# Patient Record
Sex: Male | Born: 1965 | Race: White | Hispanic: No | Marital: Married | Smoking: Former smoker
Health system: Southern US, Community
[De-identification: ages and names within clinical notes are randomized; demographics above are authoritative.]

## PROBLEM LIST (undated history)

## (undated) HISTORY — PX: CERVICAL SPINE SURGERY: SHX589

## (undated) HISTORY — PX: APPENDECTOMY: SHX54

---

## 2002-07-24 HISTORY — PX: BACK SURGERY: SHX140

## 2016-10-06 ENCOUNTER — Emergency Department (HOSPITAL_COMMUNITY): Payer: No Typology Code available for payment source

## 2016-10-06 ENCOUNTER — Inpatient Hospital Stay (HOSPITAL_COMMUNITY)
Admission: EM | Admit: 2016-10-06 | Discharge: 2016-10-10 | DRG: 505 | Disposition: A | Discharge status: Home Health | Payer: No Typology Code available for payment source | Attending: Internal Medicine | Admitting: Internal Medicine

## 2016-10-06 ENCOUNTER — Encounter (HOSPITAL_COMMUNITY): Payer: Self-pay | Admitting: Emergency Medicine

## 2016-10-06 DIAGNOSIS — R202 Paresthesia of skin: Secondary | ICD-10-CM

## 2016-10-06 DIAGNOSIS — S161XXA Strain of muscle, fascia and tendon at neck level, initial encounter: Secondary | ICD-10-CM | POA: Diagnosis not present

## 2016-10-06 DIAGNOSIS — S93324A Dislocation of tarsometatarsal joint of right foot, initial encounter: Secondary | ICD-10-CM

## 2016-10-06 DIAGNOSIS — Z981 Arthrodesis status: Secondary | ICD-10-CM

## 2016-10-06 DIAGNOSIS — R200 Anesthesia of skin: Secondary | ICD-10-CM

## 2016-10-06 DIAGNOSIS — I493 Ventricular premature depolarization: Secondary | ICD-10-CM | POA: Diagnosis present

## 2016-10-06 DIAGNOSIS — M545 Low back pain, unspecified: Secondary | ICD-10-CM

## 2016-10-06 DIAGNOSIS — S92901A Unspecified fracture of right foot, initial encounter for closed fracture: Secondary | ICD-10-CM | POA: Diagnosis present

## 2016-10-06 DIAGNOSIS — S92331A Displaced fracture of third metatarsal bone, right foot, initial encounter for closed fracture: Secondary | ICD-10-CM | POA: Diagnosis present

## 2016-10-06 DIAGNOSIS — Y92410 Unspecified street and highway as the place of occurrence of the external cause: Secondary | ICD-10-CM

## 2016-10-06 DIAGNOSIS — S92321A Displaced fracture of second metatarsal bone, right foot, initial encounter for closed fracture: Principal | ICD-10-CM | POA: Diagnosis present

## 2016-10-06 DIAGNOSIS — S0990XA Unspecified injury of head, initial encounter: Secondary | ICD-10-CM

## 2016-10-06 DIAGNOSIS — Z9889 Other specified postprocedural states: Secondary | ICD-10-CM

## 2016-10-06 DIAGNOSIS — D72829 Elevated white blood cell count, unspecified: Secondary | ICD-10-CM | POA: Diagnosis present

## 2016-10-06 LAB — COMPREHENSIVE METABOLIC PANEL
ALT: 31 U/L (ref 17–63)
AST: 28 U/L (ref 15–41)
Albumin: 4.3 g/dL (ref 3.5–5.0)
Alkaline Phosphatase: 50 U/L (ref 38–126)
Anion gap: 12 (ref 5–15)
BUN: 15 mg/dL (ref 6–20)
CHLORIDE: 104 mmol/L (ref 101–111)
CO2: 25 mmol/L (ref 22–32)
Calcium: 9.3 mg/dL (ref 8.9–10.3)
Creatinine, Ser: 1.07 mg/dL (ref 0.61–1.24)
GFR calc Af Amer: >60 mL/min (ref >60)
Glucose, Bld: 112 mg/dL — ABNORMAL HIGH (ref 65–99)
Potassium: 3.6 mmol/L (ref 3.5–5.1)
Sodium: 141 mmol/L (ref 135–145)
Total Bilirubin: 1.3 mg/dL — ABNORMAL HIGH (ref 0.3–1.2)
Total Protein: 7.2 g/dL (ref 6.5–8.1)

## 2016-10-06 LAB — I-STAT CG4 LACTIC ACID, ED: LACTIC ACID, VENOUS: 1.9 mmol/L (ref 0.5–1.9)

## 2016-10-06 LAB — CBC
HEMATOCRIT: 47.2 % (ref 39.0–52.0)
HEMOGLOBIN: 16.2 g/dL (ref 13.0–17.0)
MCH: 29.7 pg (ref 26.0–34.0)
MCHC: 34.3 g/dL (ref 30.0–36.0)
MCV: 86.4 fL (ref 78.0–100.0)
Platelets: 307 10*3/uL (ref 150–400)
RBC: 5.46 MIL/uL (ref 4.22–5.81)
RDW: 12.6 % (ref 11.5–15.5)
WBC: 14.2 10*3/uL — AB (ref 4.0–10.5)

## 2016-10-06 LAB — SAMPLE TO BLOOD BANK

## 2016-10-06 LAB — URINALYSIS, ROUTINE W REFLEX MICROSCOPIC
BACTERIA UA: NONE SEEN
BILIRUBIN URINE: NEGATIVE
Glucose, UA: NEGATIVE mg/dL
Ketones, ur: NEGATIVE mg/dL
LEUKOCYTES UA: NEGATIVE
NITRITE: NEGATIVE
PROTEIN: 30 mg/dL — AB
Specific Gravity, Urine: 1.042 — ABNORMAL HIGH (ref 1.005–1.030)
Squamous Epithelial / HPF: NONE SEEN
pH: 6 (ref 5.0–8.0)

## 2016-10-06 LAB — PROTIME-INR
INR: 1.04
Prothrombin Time: 13.6 seconds (ref 11.4–15.2)

## 2016-10-06 LAB — ETHANOL: Alcohol, Ethyl (B): <5 mg/dL (ref <5)

## 2016-10-06 MED ORDER — OXYCODONE-ACETAMINOPHEN 5-325 MG PO TABS
1.0000 | ORAL_TABLET | Freq: Once | ORAL | Status: AC
  Administered 2016-10-06: 1 via ORAL
  Filled 2016-10-06: qty 1

## 2016-10-06 MED ORDER — ONDANSETRON HCL 4 MG/2ML IJ SOLN
4.0000 mg | Freq: Once | INTRAMUSCULAR | Status: AC
  Administered 2016-10-06: 4 mg via INTRAVENOUS
  Filled 2016-10-06: qty 2

## 2016-10-06 MED ORDER — SODIUM CHLORIDE 0.9 % IV BOLUS (SEPSIS)
1000.0000 mL | Freq: Once | INTRAVENOUS | Status: AC
  Administered 2016-10-06: 1000 mL via INTRAVENOUS

## 2016-10-06 MED ORDER — MORPHINE SULFATE (PF) 4 MG/ML IV SOLN
4.0000 mg | Freq: Once | INTRAVENOUS | Status: AC
  Administered 2016-10-06: 4 mg via INTRAVENOUS
  Filled 2016-10-06: qty 1

## 2016-10-06 MED ORDER — HYDROMORPHONE HCL 1 MG/ML IJ SOLN
1.0000 mg | INTRAMUSCULAR | Status: DC | PRN
  Administered 2016-10-06 – 2016-10-07 (×3): 1 mg via INTRAVENOUS
  Filled 2016-10-06 (×3): qty 1

## 2016-10-06 MED ORDER — IOPAMIDOL (ISOVUE-300) INJECTION 61%
INTRAVENOUS | Status: AC
  Administered 2016-10-06: 100 mL
  Filled 2016-10-06: qty 100

## 2016-10-06 MED ORDER — LIDOCAINE HCL (PF) 1 % IJ SOLN
5.0000 mL | Freq: Once | INTRAMUSCULAR | Status: AC
  Administered 2016-10-06: 5 mL
  Filled 2016-10-06: qty 5

## 2016-10-06 NOTE — ED Notes (Signed)
MD at bedside. 

## 2016-10-06 NOTE — ED Notes (Signed)
Patient in xray 
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
## 2016-10-06 NOTE — ED Notes (Signed)
Patient returned to room and placed back on monitor.  
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
## 2016-10-06 NOTE — ED Notes (Signed)
Care handoff to Brooke RN 

## 2016-10-06 NOTE — ED Notes (Signed)
Patient's mother, Lucien Mons, may be reached at [phone].

## 2016-10-06 NOTE — ED Notes (Signed)
Patient transported to CT 

## 2016-10-06 NOTE — ED Notes (Signed)
PT taken to radiology.

## 2016-10-07 ENCOUNTER — Observation Stay (HOSPITAL_COMMUNITY): Payer: No Typology Code available for payment source | Admitting: Anesthesiology

## 2016-10-07 ENCOUNTER — Encounter (HOSPITAL_COMMUNITY)
Admission: EM | Disposition: A | Discharge status: Home Health | Payer: Self-pay | Source: Home / Self Care | Attending: Internal Medicine

## 2016-10-07 ENCOUNTER — Encounter (HOSPITAL_COMMUNITY): Payer: Self-pay | Admitting: Anesthesiology

## 2016-10-07 ENCOUNTER — Emergency Department (HOSPITAL_COMMUNITY): Payer: No Typology Code available for payment source

## 2016-10-07 DIAGNOSIS — S92321A Displaced fracture of second metatarsal bone, right foot, initial encounter for closed fracture: Secondary | ICD-10-CM | POA: Diagnosis present

## 2016-10-07 DIAGNOSIS — I493 Ventricular premature depolarization: Secondary | ICD-10-CM | POA: Diagnosis present

## 2016-10-07 DIAGNOSIS — S0990XA Unspecified injury of head, initial encounter: Secondary | ICD-10-CM

## 2016-10-07 DIAGNOSIS — M545 Low back pain: Secondary | ICD-10-CM

## 2016-10-07 DIAGNOSIS — S161XXA Strain of muscle, fascia and tendon at neck level, initial encounter: Secondary | ICD-10-CM | POA: Diagnosis present

## 2016-10-07 DIAGNOSIS — S62321A Displaced fracture of shaft of second metacarpal bone, left hand, initial encounter for closed fracture: Secondary | ICD-10-CM

## 2016-10-07 DIAGNOSIS — D72829 Elevated white blood cell count, unspecified: Secondary | ICD-10-CM

## 2016-10-07 DIAGNOSIS — S93314A Dislocation of tarsal joint of right foot, initial encounter: Secondary | ICD-10-CM

## 2016-10-07 DIAGNOSIS — S92901A Unspecified fracture of right foot, initial encounter for closed fracture: Secondary | ICD-10-CM | POA: Diagnosis present

## 2016-10-07 DIAGNOSIS — Z981 Arthrodesis status: Secondary | ICD-10-CM | POA: Diagnosis not present

## 2016-10-07 DIAGNOSIS — S92331B Displaced fracture of third metatarsal bone, right foot, initial encounter for open fracture: Secondary | ICD-10-CM

## 2016-10-07 DIAGNOSIS — S93324A Dislocation of tarsometatarsal joint of right foot, initial encounter: Secondary | ICD-10-CM

## 2016-10-07 DIAGNOSIS — S92331A Displaced fracture of third metatarsal bone, right foot, initial encounter for closed fracture: Secondary | ICD-10-CM | POA: Diagnosis present

## 2016-10-07 DIAGNOSIS — Y92410 Unspecified street and highway as the place of occurrence of the external cause: Secondary | ICD-10-CM | POA: Diagnosis not present

## 2016-10-07 HISTORY — PX: ORIF TOE FRACTURE: SHX5032

## 2016-10-07 HISTORY — PX: ORIF ANKLE FRACTURE: SUR919

## 2016-10-07 LAB — COMPREHENSIVE METABOLIC PANEL
ALBUMIN: 4 g/dL (ref 3.5–5.0)
ALT: 26 U/L (ref 17–63)
AST: 24 U/L (ref 15–41)
Alkaline Phosphatase: 45 U/L (ref 38–126)
Anion gap: 9 (ref 5–15)
BUN: 10 mg/dL (ref 6–20)
CHLORIDE: 104 mmol/L (ref 101–111)
CO2: 25 mmol/L (ref 22–32)
CREATININE: 1 mg/dL (ref 0.61–1.24)
Calcium: 8.8 mg/dL — ABNORMAL LOW (ref 8.9–10.3)
GFR calc Af Amer: >60 mL/min (ref >60)
GFR calc non Af Amer: >60 mL/min (ref >60)
Glucose, Bld: 114 mg/dL — ABNORMAL HIGH (ref 65–99)
POTASSIUM: 3.8 mmol/L (ref 3.5–5.1)
SODIUM: 138 mmol/L (ref 135–145)
Total Bilirubin: 1.8 mg/dL — ABNORMAL HIGH (ref 0.3–1.2)
Total Protein: 6.9 g/dL (ref 6.5–8.1)

## 2016-10-07 LAB — CBC
HCT: 44.5 % (ref 39.0–52.0)
Hemoglobin: 15.2 g/dL (ref 13.0–17.0)
MCH: 29.6 pg (ref 26.0–34.0)
MCHC: 34.2 g/dL (ref 30.0–36.0)
MCV: 86.7 fL (ref 78.0–100.0)
PLATELETS: 284 10*3/uL (ref 150–400)
RBC: 5.13 MIL/uL (ref 4.22–5.81)
RDW: 12.7 % (ref 11.5–15.5)
WBC: 13.4 10*3/uL — AB (ref 4.0–10.5)

## 2016-10-07 LAB — SURGICAL PCR SCREEN
MRSA, PCR: NEGATIVE
Staphylococcus aureus: NEGATIVE

## 2016-10-07 LAB — MAGNESIUM: MAGNESIUM: 1.8 mg/dL (ref 1.7–2.4)

## 2016-10-07 LAB — TROPONIN I: Troponin I: <0.03 ng/mL (ref <0.03)

## 2016-10-07 LAB — TSH: TSH: 4.649 u[IU]/mL — AB (ref 0.350–4.500)

## 2016-10-07 LAB — HIV ANTIBODY (ROUTINE TESTING W REFLEX): HIV SCREEN 4TH GENERATION: NONREACTIVE

## 2016-10-07 LAB — T4, FREE: FREE T4: 0.87 ng/dL (ref 0.61–1.12)

## 2016-10-07 SURGERY — OPEN REDUCTION INTERNAL FIXATION (ORIF) METATARSAL (TOE) FRACTURE
Anesthesia: General | Laterality: Right

## 2016-10-07 MED ORDER — SODIUM CHLORIDE 0.9% FLUSH
3.0000 mL | Freq: Two times a day (BID) | INTRAVENOUS | Status: DC
  Administered 2016-10-08 – 2016-10-09 (×3): 3 mL via INTRAVENOUS

## 2016-10-07 MED ORDER — SODIUM CHLORIDE 0.9 % IV SOLN
INTRAVENOUS | Status: DC
  Administered 2016-10-09: 06:00:00 via INTRAVENOUS

## 2016-10-07 MED ORDER — CHLORHEXIDINE GLUCONATE 4 % EX LIQD: 60.0000 mL | Freq: Once | CUTANEOUS | Status: DC

## 2016-10-07 MED ORDER — PROMETHAZINE HCL 25 MG/ML IJ SOLN
6.2500 mg | INTRAMUSCULAR | Status: DC | PRN
  Administered 2016-10-07: 6.25 mg via INTRAVENOUS

## 2016-10-07 MED ORDER — PROPOFOL 10 MG/ML IV BOLUS
INTRAVENOUS | Status: DC | PRN
  Administered 2016-10-07: 150 mg via INTRAVENOUS

## 2016-10-07 MED ORDER — SODIUM CHLORIDE 0.9 % IV SOLN
INTRAVENOUS | Status: DC | PRN
  Administered 2016-10-07 (×4): 80 ug via INTRAVENOUS

## 2016-10-07 MED ORDER — LIDOCAINE HCL (CARDIAC) 20 MG/ML IV SOLN
INTRAVENOUS | Status: DC | PRN
  Administered 2016-10-07: 40 mg via INTRAVENOUS

## 2016-10-07 MED ORDER — PHENYLEPHRINE HCL 10 MG/ML IJ SOLN
INTRAVENOUS | Status: DC | PRN
  Administered 2016-10-07: 50 ug/min via INTRAVENOUS

## 2016-10-07 MED ORDER — ONDANSETRON HCL 4 MG/2ML IJ SOLN
4.0000 mg | Freq: Four times a day (QID) | INTRAMUSCULAR | Status: DC | PRN
  Filled 2016-10-07: qty 2

## 2016-10-07 MED ORDER — ONDANSETRON HCL 4 MG/2ML IJ SOLN
INTRAMUSCULAR | Status: DC | PRN
  Administered 2016-10-07: 4 mg via INTRAVENOUS

## 2016-10-07 MED ORDER — BACITRACIN ZINC 500 UNIT/GM EX OINT
TOPICAL_OINTMENT | CUTANEOUS | Status: DC | PRN
  Administered 2016-10-07: 1 via TOPICAL

## 2016-10-07 MED ORDER — CEFAZOLIN SODIUM-DEXTROSE 2-4 GM/100ML-% IV SOLN
2.0000 g | INTRAVENOUS | Status: AC
  Administered 2016-10-07: 2 g via INTRAVENOUS
  Filled 2016-10-07: qty 100

## 2016-10-07 MED ORDER — LORAZEPAM 2 MG/ML IJ SOLN: 0.5000 mg | INTRAMUSCULAR | Status: DC | PRN

## 2016-10-07 MED ORDER — HYDROMORPHONE HCL 1 MG/ML IJ SOLN: 0.2500 mg | INTRAMUSCULAR | Status: DC | PRN

## 2016-10-07 MED ORDER — ACETAMINOPHEN 325 MG PO TABS: 650.0000 mg | ORAL_TABLET | Freq: Four times a day (QID) | ORAL | Status: DC | PRN

## 2016-10-07 MED ORDER — HYDROMORPHONE HCL 2 MG/ML IJ SOLN
1.0000 mg | INTRAMUSCULAR | Status: DC | PRN
  Administered 2016-10-07 – 2016-10-09 (×15): 1 mg via INTRAVENOUS
  Filled 2016-10-07 (×16): qty 1

## 2016-10-07 MED ORDER — BACITRACIN ZINC 500 UNIT/GM EX OINT
TOPICAL_OINTMENT | CUTANEOUS | Status: AC
  Filled 2016-10-07: qty 28.35

## 2016-10-07 MED ORDER — FENTANYL CITRATE (PF) 100 MCG/2ML IJ SOLN
INTRAMUSCULAR | Status: AC
  Filled 2016-10-07: qty 2

## 2016-10-07 MED ORDER — ONDANSETRON HCL 4 MG PO TABS: 4.0000 mg | ORAL_TABLET | Freq: Four times a day (QID) | ORAL | Status: DC | PRN

## 2016-10-07 MED ORDER — CEFAZOLIN SODIUM-DEXTROSE 2-4 GM/100ML-% IV SOLN
2.0000 g | Freq: Four times a day (QID) | INTRAVENOUS | Status: AC
  Administered 2016-10-08 (×3): 2 g via INTRAVENOUS
  Filled 2016-10-07 (×4): qty 100

## 2016-10-07 MED ORDER — PROMETHAZINE HCL 25 MG/ML IJ SOLN
INTRAMUSCULAR | Status: AC
  Administered 2016-10-07: 6.25 mg via INTRAVENOUS
  Filled 2016-10-07: qty 1

## 2016-10-07 MED ORDER — ALBUTEROL SULFATE (2.5 MG/3ML) 0.083% IN NEBU: 2.5000 mg | INHALATION_SOLUTION | RESPIRATORY_TRACT | Status: DC | PRN

## 2016-10-07 MED ORDER — HYDROMORPHONE HCL 2 MG/ML IJ SOLN
0.5000 mg | Freq: Once | INTRAMUSCULAR | Status: AC
  Administered 2016-10-07: 0.5 mg via INTRAVENOUS

## 2016-10-07 MED ORDER — ACETAMINOPHEN 650 MG RE SUPP: 650.0000 mg | Freq: Four times a day (QID) | RECTAL | Status: DC | PRN

## 2016-10-07 MED ORDER — METOCLOPRAMIDE HCL 5 MG/ML IJ SOLN: 5.0000 mg | Freq: Three times a day (TID) | INTRAMUSCULAR | Status: DC | PRN

## 2016-10-07 MED ORDER — MIDAZOLAM HCL 2 MG/2ML IJ SOLN
INTRAMUSCULAR | Status: AC
  Administered 2016-10-07: 1 mg via INTRAVENOUS
  Filled 2016-10-07: qty 2

## 2016-10-07 MED ORDER — MIDAZOLAM HCL 2 MG/2ML IJ SOLN
1.0000 mg | Freq: Once | INTRAMUSCULAR | Status: AC
  Administered 2016-10-07: 1 mg via INTRAVENOUS

## 2016-10-07 MED ORDER — METOCLOPRAMIDE HCL 5 MG PO TABS: 5.0000 mg | ORAL_TABLET | Freq: Three times a day (TID) | ORAL | Status: DC | PRN

## 2016-10-07 MED ORDER — FENTANYL CITRATE (PF) 100 MCG/2ML IJ SOLN
INTRAMUSCULAR | Status: DC | PRN
  Administered 2016-10-07: 100 ug via INTRAVENOUS

## 2016-10-07 MED ORDER — LACTATED RINGERS IV SOLN
INTRAVENOUS | Status: DC | PRN
  Administered 2016-10-07 (×2): via INTRAVENOUS

## 2016-10-07 MED ORDER — BUPIVACAINE-EPINEPHRINE (PF) 0.5% -1:200000 IJ SOLN
INTRAMUSCULAR | Status: DC | PRN
  Administered 2016-10-07: 40 mL via PERINEURAL

## 2016-10-07 MED ORDER — 0.9 % SODIUM CHLORIDE (POUR BTL) OPTIME
TOPICAL | Status: DC | PRN
  Administered 2016-10-07: 400 mL

## 2016-10-07 SURGICAL SUPPLY — 65 items
1.6MM KWIRE ×9 IMPLANT
BANDAGE ACE 3X5.8 VEL STRL LF (GAUZE/BANDAGES/DRESSINGS) ×3 IMPLANT
BANDAGE ACE 4X5 VEL STRL LF (GAUZE/BANDAGES/DRESSINGS) ×6 IMPLANT
BANDAGE ELASTIC 4 VELCRO ST LF (GAUZE/BANDAGES/DRESSINGS) ×3 IMPLANT
BIT DRILL CANN 2.7 (BIT) ×1
BIT DRILL CANN 2.7MM (BIT) ×1
BIT DRILL CANNULATED 2.7 (BIT) ×1 IMPLANT
BIT DRILL SRG 2.7XCANN AO CPLG (BIT) ×1 IMPLANT
BIT DRL SRG 2.7XCANN AO CPLNG (BIT) ×1
BLADE SURG 15 STRL LF DISP TIS (BLADE) ×4 IMPLANT
BLADE SURG 15 STRL SS (BLADE) ×8
BNDG ESMARK 4X9 LF (GAUZE/BANDAGES/DRESSINGS) ×3 IMPLANT
BNDG GAUZE ELAST 4 BULKY (GAUZE/BANDAGES/DRESSINGS) ×3 IMPLANT
CHLORAPREP W/TINT 26ML (MISCELLANEOUS) ×3 IMPLANT
CUFF TOURNIQUET SINGLE 34IN LL (TOURNIQUET CUFF) ×3 IMPLANT
DRAPE EXTREMITY T 121X128X90 (DRAPE) IMPLANT
DRAPE OEC MINIVIEW 54X84 (DRAPES) ×3 IMPLANT
DRAPE SURG 17X23 STRL (DRAPES) IMPLANT
DRAPE U-SHAPE 47X51 STRL (DRAPES) ×3 IMPLANT
DRILL CANNULATED 2.7 (BIT) ×3
ELECT REM PT RETURN 9FT ADLT (ELECTROSURGICAL) ×3
ELECTRODE REM PT RTRN 9FT ADLT (ELECTROSURGICAL) ×1 IMPLANT
GAUZE SPONGE 4X4 12PLY STRL (GAUZE/BANDAGES/DRESSINGS) ×3 IMPLANT
GAUZE SPONGE 4X4 12PLY STRL LF (GAUZE/BANDAGES/DRESSINGS) ×3 IMPLANT
GAUZE SPONGE 4X4 16PLY XRAY LF (GAUZE/BANDAGES/DRESSINGS) IMPLANT
GAUZE XEROFORM 1X8 LF (GAUZE/BANDAGES/DRESSINGS) ×3 IMPLANT
GAUZE XEROFORM 5X9 LF (GAUZE/BANDAGES/DRESSINGS) ×3 IMPLANT
GLOVE BIO SURGEON STRL SZ7.5 (GLOVE) IMPLANT
GLOVE BIO SURGEON STRL SZ8 (GLOVE) IMPLANT
GLOVE BIOGEL PI IND STRL 8 (GLOVE) IMPLANT
GLOVE BIOGEL PI INDICATOR 8 (GLOVE)
GOWN STRL REUS W/ TWL LRG LVL3 (GOWN DISPOSABLE) ×1 IMPLANT
GOWN STRL REUS W/ TWL XL LVL3 (GOWN DISPOSABLE) ×1 IMPLANT
GOWN STRL REUS W/TWL LRG LVL3 (GOWN DISPOSABLE) ×2
GOWN STRL REUS W/TWL XL LVL3 (GOWN DISPOSABLE) ×2
K-WIRE ORTHOPEDIC 1.4X150L (WIRE) ×6
KIT BASIN OR (CUSTOM PROCEDURE TRAY) ×3 IMPLANT
KWIRE ORTHOPEDIC 1.4X150L (WIRE) ×2 IMPLANT
NDL SAFETY ECLIPSE 18X1.5 (NEEDLE) IMPLANT
NEEDLE HYPO 18GX1.5 SHARP (NEEDLE)
NEEDLE HYPO 25X1 1.5 SAFETY (NEEDLE) IMPLANT
NS IRRIG 1000ML POUR BTL (IV SOLUTION) IMPLANT
PACK ORTHO EXTREMITY (CUSTOM PROCEDURE TRAY) ×3 IMPLANT
PADDING CAST ABS 4INX4YD NS (CAST SUPPLIES) ×4
PADDING CAST ABS COTTON 4X4 ST (CAST SUPPLIES) ×2 IMPLANT
PENCIL BUTTON HOLSTER BLD 10FT (ELECTRODE) ×3 IMPLANT
SCREW CANN 44X15X4XSLF DRL (Screw) ×1 IMPLANT
SCREW CANNU 4.0X48MM (Screw) ×3 IMPLANT
SCREW CANNULATED 4.0X44MM (Screw) ×2 IMPLANT
SPLINT FIBERGLASS 4X30 (CAST SUPPLIES) ×3 IMPLANT
STAPLER SKIN 35 REG (STAPLE) ×3 IMPLANT
STAPLER VISISTAT (STAPLE) ×3 IMPLANT
STAPLER VISISTAT 35W (STAPLE) ×3 IMPLANT
STOCKINETTE 6  STRL (DRAPES) ×2
STOCKINETTE 6 STRL (DRAPES) ×1 IMPLANT
SUT MNCRL AB 3-0 PS2 18 (SUTURE) ×3 IMPLANT
SUT MNCRL AB 4-0 PS2 18 (SUTURE) ×3 IMPLANT
SUT MON AB 5-0 PS2 18 (SUTURE) IMPLANT
SUT PROLENE 4 0 PS 2 18 (SUTURE) IMPLANT
SUT VIC AB 3-0 PS2 18 (SUTURE) ×3 IMPLANT
SUT VIC AB 4-0 PS2 27 (SUTURE) ×3 IMPLANT
SUT VICRYL 4-0 PS2 18IN ABS (SUTURE) IMPLANT
SYR BULB 3OZ (MISCELLANEOUS) ×3 IMPLANT
SYR CONTROL 10ML LL (SYRINGE) IMPLANT
UNDERPAD 30X30 (UNDERPADS AND DIAPERS) ×3 IMPLANT

## 2016-10-07 NOTE — Progress Notes (Signed)
Spoke to Dr. Amalia Hailey via phone. No orders postop noted. Stated orders will be placed very shortly.

## 2016-10-07 NOTE — ED Notes (Signed)
Dr. Laverta Baltimore at bedside resetting patient's RIGHT second toe at this time.

## 2016-10-07 NOTE — Anesthesia Postprocedure Evaluation (Addendum)
Anesthesia Post Note  Patient: Benjamin Collins  Procedure(s) Performed: Procedure(s) (LRB): OPEN REDUCTION INTERNAL FIXATION (ORIF)  MULTIPLE FOOT FRACTURE (Right)  Patient location during evaluation: PACU Anesthesia Type: General Level of consciousness: sedated Pain management: pain level controlled Vital Signs Assessment: post-procedure vital signs reviewed and stable Respiratory status: spontaneous breathing and respiratory function stable Cardiovascular status: stable Anesthetic complications: no       Last Vitals:  Vitals:   10/07/16 2000 10/07/16 2030  BP: 132/73 (!) 143/95  Pulse:    Resp:    Temp:      Last Pain:  Vitals:   10/07/16 2030  TempSrc:   PainSc: 0-No pain                 Scottie Stanish DANIEL
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
## 2016-10-07 NOTE — Anesthesia Procedure Notes (Signed)
Procedure Name: LMA Insertion Date/Time: 10/07/2016 4:56 PM Performed by: Clearnce Sorrel Pre-anesthesia Checklist: Patient identified, Emergency Drugs available, Suction available, Patient being monitored and Timeout performed Patient Re-evaluated:Patient Re-evaluated prior to inductionOxygen Delivery Method: Circle system utilized Preoxygenation: Pre-oxygenation with 100% oxygen Intubation Type: IV induction LMA: LMA inserted LMA Size: 5.0 Number of attempts: 1 Placement Confirmation: positive ETCO2 and breath sounds checked- equal and bilateral Tube secured with: Tape Dental Injury: Teeth and Oropharynx as per pre-operative assessment

## 2016-10-07 NOTE — ED Notes (Signed)
Jerene Pitch, RN notified ortho tech for cam walker.

## 2016-10-07 NOTE — Progress Notes (Signed)
Patient on continuous heart monitor during block.
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
## 2016-10-07 NOTE — ED Notes (Signed)
Tilda Burrow Pulled Dilaudid for Callie Fielding and was given to Pt and had to document under Lovena Le due to not being able to access epic on 10/07/16. Lovena Le could not go into room and document due to her not being able to log off one computer and back into epic again.

## 2016-10-07 NOTE — Brief Op Note (Signed)
10/06/2016 - 10/07/2016  8:49 PM  PATIENT:  Benjamin Collins  51 y.o. male  PRE-OPERATIVE DIAGNOSIS:  MULTIPLE FOOT FRACTURES - RIGHT  POST-OPERATIVE DIAGNOSIS:  OPEN REDUCTION INTERNAL FIXATION SECOND METATARSAL PHALANGEAL JOINT, FRACTURE-DISLOCATION THIRD METATARSAL; NAVICULAR MEDIAL CUNEFORM FRACTURE DISLOCATION , LIS FRANC FRACTURE DISLOCATION - RIGHT  PROCEDURE:  Procedure(s): OPEN REDUCTION INTERNAL FIXATION (ORIF)  MULTIPLE FOOT FRACTURE (Right)  SURGEON:  Surgeon(s) and Role:    * Edrick Kins, DPM - Primary  PHYSICIAN ASSISTANT: none  ASSISTANTS: none   ANESTHESIA:   regional  EBL:  Total I/O In: 1000 [I.V.:1000] Out: 285 [Urine:275; Blood:10]  BLOOD ADMINISTERED:none  DRAINS: none   LOCAL MEDICATIONS USED:  NONE  SPECIMEN:  No Specimen  DISPOSITION OF SPECIMEN:  N/A  COUNTS:  YES  TOURNIQUET:   Total Tourniquet Time Documented: Calf (Right) - 2 minutes Total: Calf (Right) - 2 minutes   DICTATION: .Viviann Spare Dictation  PLAN OF CARE: Admit to inpatient   PATIENT DISPOSITION:  PACU - hemodynamically stable.   Delay start of Pharmacological VTE agent (>24hrs) due to surgical blood loss or risk of bleeding: no

## 2016-10-07 NOTE — ED Notes (Signed)
Patient transported to CT 

## 2016-10-07 NOTE — Anesthesia Preprocedure Evaluation (Addendum)
Anesthesia Evaluation  Patient identified by MRN, date of birth, ID band Patient awake    Reviewed: Allergy & Precautions, NPO status , Patient's Chart, lab work & pertinent test results  History of Anesthesia Complications Negative for: history of anesthetic complications  Airway Mallampati: II  TM Distance: >3 FB Neck ROM: Full    Dental  (+) Partial Upper, Dental Advisory Given   Pulmonary neg pulmonary ROS,    Pulmonary exam normal        Cardiovascular negative cardio ROS Normal cardiovascular exam     Neuro/Psych negative neurological ROS  negative psych ROS   GI/Hepatic negative GI ROS, Neg liver ROS,   Endo/Other  negative endocrine ROS  Renal/GU negative Renal ROS  negative genitourinary   Musculoskeletal   Abdominal   Peds negative pediatric ROS (+)  Hematology negative hematology ROS (+)   Anesthesia Other Findings   Reproductive/Obstetrics negative OB ROS                            Anesthesia Physical Anesthesia Plan  ASA: II  Anesthesia Plan: General   Post-op Pain Management: GA combined w/ Regional for post-op pain   Induction: Intravenous  Airway Management Planned: LMA  Additional Equipment:   Intra-op Plan:   Post-operative Plan: Extubation in OR  Informed Consent: I have reviewed the patients History and Physical, chart, labs and discussed the procedure including the risks, benefits and alternatives for the proposed anesthesia with the patient or authorized representative who has indicated his/her understanding and acceptance.   Dental advisory given  Plan Discussed with: CRNA and Anesthesiologist  Anesthesia Plan Comments:        Anesthesia Quick Evaluation

## 2016-10-07 NOTE — Transfer of Care (Signed)
Immediate Anesthesia Transfer of Care Note  Patient: Benjamin Collins  Procedure(s) Performed: Procedure(s): OPEN REDUCTION INTERNAL FIXATION (ORIF)  MULTIPLE FOOT FRACTURE (Right)  Patient Location: PACU  Anesthesia Type:General and Regional  Level of Consciousness: responds to stimulation  Airway & Oxygen Therapy: Patient Spontanous Breathing and Patient connected to face mask oxygen  Post-op Assessment: Report given to RN and Post -op Vital signs reviewed and stable  Post vital signs: Reviewed and stable  Last Vitals:  Vitals:   10/07/16 1640 10/07/16 1940  BP: (!) 106/54 (P) 136/77  Pulse: 95   Resp: 15   Temp:  (P) 36.4 C    Last Pain:  Vitals:   10/07/16 1940  TempSrc:   PainSc: (P) Asleep         Complications: No apparent anesthesia complications

## 2016-10-07 NOTE — Progress Notes (Signed)
Orthopedic Tech Progress Note Patient Details:  Benjamin Collins June 24, 1966 [id number]  Ortho Devices Type of Ortho Device: CAM walker Ortho Device/Splint Location: rle Ortho Device/Splint Interventions: Ordered Pt was having to much pain to put on cam walker. I left it with him in the room.  Karolee Stamps 10/07/2016, 6:45 AM

## 2016-10-07 NOTE — Anesthesia Procedure Notes (Addendum)
Anesthesia Regional Block: Popliteal block   Pre-Anesthetic Checklist: ,, timeout performed, Correct Patient, Correct Site, Correct Laterality, Correct Procedure, Correct Position, site marked, Risks and benefits discussed,  Surgical consent,  Pre-op evaluation,  At surgeon's request and post-op pain management  Laterality: Right  Prep: chloraprep       Needles:  Injection technique: Single-shot  Needle Type: Echogenic Stimulator Needle          Additional Needles:   Procedures: ultrasound guided,,,,,,,,   Nerve Stimulator or Paresthesia:  Response: 0.45 mA,   Additional Responses:   Narrative:  Start time: 10/07/2016 3:37 PM End time: 10/07/2016 3:47 PM Injection made incrementally with aspirations every 5 mL.  Performed by: Personally  Anesthesiologist: Duane Boston  Additional Notes: A functioning IV was confirmed and monitors were applied.  Sterile prep and drape, hand hygiene and sterile gloves were used.  Negative aspiration and test dose prior to incremental administration of local anesthetic. The patient tolerated the procedure well.Ultrasound  guidance: relevant anatomy identified, needle position confirmed, local anesthetic spread visualized around nerve(s), vascular puncture avoided.  Image printed for medical record. ADC added

## 2016-10-07 NOTE — Interval H&P Note (Signed)
History and Physical Interval Note:  10/07/2016 4:49 PM  [name]  has presented today for surgery, with the diagnosis of foot fx  The various methods of treatment have been discussed with the patient and family. After consideration of risks, benefits and other options for treatment, the patient has consented to  Procedure(s): OPEN REDUCTION INTERNAL FIXATION (ORIF)  MULTIPLE FOOT FRACTURE (Right) as a surgical intervention .  The patient's history has been reviewed, patient examined, no change in status, stable for surgery.  I have reviewed the patient's chart and labs.  Questions were answered to the patient's satisfaction.     Benjamin Collins
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
## 2016-10-07 NOTE — ED Notes (Signed)
This is Benjamin Collins documenting under Erricka Falkner due to computer issues. Pt does not want to wear the boot due to discomfort.

## 2016-10-08 ENCOUNTER — Encounter (HOSPITAL_COMMUNITY): Payer: Self-pay | Admitting: *Deleted

## 2016-10-08 ENCOUNTER — Inpatient Hospital Stay (HOSPITAL_COMMUNITY): Payer: No Typology Code available for payment source

## 2016-10-08 LAB — BASIC METABOLIC PANEL
Anion gap: 6 (ref 5–15)
BUN: 11 mg/dL (ref 6–20)
CALCIUM: 8.3 mg/dL — AB (ref 8.9–10.3)
CO2: 27 mmol/L (ref 22–32)
Chloride: 103 mmol/L (ref 101–111)
Creatinine, Ser: 1.04 mg/dL (ref 0.61–1.24)
GFR calc Af Amer: >60 mL/min (ref >60)
GLUCOSE: 102 mg/dL — AB (ref 65–99)
Potassium: 3.8 mmol/L (ref 3.5–5.1)
Sodium: 136 mmol/L (ref 135–145)

## 2016-10-08 LAB — CBC
HEMATOCRIT: 41.5 % (ref 39.0–52.0)
HEMOGLOBIN: 13.8 g/dL (ref 13.0–17.0)
MCH: 29.3 pg (ref 26.0–34.0)
MCHC: 33.3 g/dL (ref 30.0–36.0)
MCV: 88.1 fL (ref 78.0–100.0)
PLATELETS: 221 10*3/uL (ref 150–400)
RBC: 4.71 MIL/uL (ref 4.22–5.81)
RDW: 12.6 % (ref 11.5–15.5)
WBC: 12.9 10*3/uL — AB (ref 4.0–10.5)

## 2016-10-08 LAB — MAGNESIUM: Magnesium: 1.8 mg/dL (ref 1.7–2.4)

## 2016-10-08 MED ORDER — SENNOSIDES-DOCUSATE SODIUM 8.6-50 MG PO TABS
1.0000 | ORAL_TABLET | Freq: Two times a day (BID) | ORAL | Status: DC
  Administered 2016-10-08 – 2016-10-09 (×4): 1 via ORAL
  Filled 2016-10-08 (×5): qty 1

## 2016-10-08 MED ORDER — OXYCODONE-ACETAMINOPHEN 5-325 MG PO TABS
2.0000 | ORAL_TABLET | ORAL | Status: DC | PRN
  Administered 2016-10-09 – 2016-10-10 (×7): 2 via ORAL
  Filled 2016-10-08 (×7): qty 2

## 2016-10-08 MED ORDER — OXYCODONE-ACETAMINOPHEN 5-325 MG PO TABS
2.0000 | ORAL_TABLET | Freq: Four times a day (QID) | ORAL | Status: DC | PRN
  Administered 2016-10-08 (×2): 2 via ORAL
  Filled 2016-10-08 (×2): qty 2

## 2016-10-08 MED ORDER — DIPHENHYDRAMINE HCL 25 MG PO CAPS
25.0000 mg | ORAL_CAPSULE | ORAL | Status: DC | PRN
  Administered 2016-10-08 – 2016-10-09 (×2): 25 mg via ORAL
  Filled 2016-10-08 (×2): qty 1

## 2016-10-08 MED ORDER — POLYETHYLENE GLYCOL 3350 17 G PO PACK
17.0000 g | PACK | Freq: Every day | ORAL | Status: DC
  Administered 2016-10-08: 17 g via ORAL
  Filled 2016-10-08 (×3): qty 1

## 2016-10-08 MED ORDER — METHOCARBAMOL 500 MG PO TABS
500.0000 mg | ORAL_TABLET | Freq: Three times a day (TID) | ORAL | Status: DC
  Administered 2016-10-08 – 2016-10-10 (×6): 500 mg via ORAL
  Filled 2016-10-08 (×7): qty 1

## 2016-10-08 MED ORDER — IBUPROFEN 200 MG PO TABS
400.0000 mg | ORAL_TABLET | Freq: Once | ORAL | Status: AC
  Administered 2016-10-08: 400 mg via ORAL
  Filled 2016-10-08: qty 2

## 2016-10-08 MED ORDER — GADOBENATE DIMEGLUMINE 529 MG/ML IV SOLN: 20.0000 mL | Freq: Once | INTRAVENOUS | Status: DC | PRN
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
## 2016-10-08 NOTE — Progress Notes (Signed)
Pt is going off the floor for x-ray
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
## 2016-10-09 ENCOUNTER — Other Ambulatory Visit: Payer: Self-pay

## 2016-10-09 ENCOUNTER — Encounter (HOSPITAL_COMMUNITY): Payer: Self-pay | Admitting: General Practice

## 2016-10-09 LAB — BASIC METABOLIC PANEL
Anion gap: 8 (ref 5–15)
BUN: 10 mg/dL (ref 6–20)
CHLORIDE: 101 mmol/L (ref 101–111)
CO2: 29 mmol/L (ref 22–32)
CREATININE: 0.99 mg/dL (ref 0.61–1.24)
Calcium: 8.5 mg/dL — ABNORMAL LOW (ref 8.9–10.3)
GFR calc Af Amer: >60 mL/min (ref >60)
GFR calc non Af Amer: >60 mL/min (ref >60)
Glucose, Bld: 90 mg/dL (ref 65–99)
Potassium: 4 mmol/L (ref 3.5–5.1)
SODIUM: 138 mmol/L (ref 135–145)

## 2016-10-09 LAB — CBC
HCT: 41.7 % (ref 39.0–52.0)
HEMOGLOBIN: 13.9 g/dL (ref 13.0–17.0)
MCH: 29.3 pg (ref 26.0–34.0)
MCHC: 33.3 g/dL (ref 30.0–36.0)
MCV: 87.8 fL (ref 78.0–100.0)
PLATELETS: 234 10*3/uL (ref 150–400)
RBC: 4.75 MIL/uL (ref 4.22–5.81)
RDW: 12.6 % (ref 11.5–15.5)
WBC: 12.9 10*3/uL — ABNORMAL HIGH (ref 4.0–10.5)

## 2016-10-09 MED ORDER — OXYCODONE-ACETAMINOPHEN 5-325 MG PO TABS: 1.0000 | ORAL_TABLET | Freq: Three times a day (TID) | ORAL | 0 refills | Status: DC | PRN

## 2016-10-09 MED ORDER — GABAPENTIN 100 MG PO CAPS: 100.0000 mg | ORAL_CAPSULE | Freq: Two times a day (BID) | ORAL | 0 refills | Status: DC

## 2016-10-09 MED ORDER — HYDROXYZINE HCL 25 MG PO TABS: 25.0000 mg | ORAL_TABLET | Freq: Three times a day (TID) | ORAL | 0 refills | Status: DC | PRN

## 2016-10-09 MED ORDER — GABAPENTIN 100 MG PO CAPS
100.0000 mg | ORAL_CAPSULE | Freq: Two times a day (BID) | ORAL | Status: DC
  Administered 2016-10-09 – 2016-10-10 (×3): 100 mg via ORAL
  Filled 2016-10-09 (×3): qty 1

## 2016-10-09 MED ORDER — HYDROXYZINE HCL 25 MG PO TABS: 25.0000 mg | ORAL_TABLET | Freq: Three times a day (TID) | ORAL | Status: DC | PRN

## 2016-10-09 MED ORDER — SENNOSIDES-DOCUSATE SODIUM 8.6-50 MG PO TABS: 1.0000 | ORAL_TABLET | Freq: Every evening | ORAL | 0 refills | Status: DC | PRN

## 2016-10-09 MED ORDER — METHOCARBAMOL 500 MG PO TABS: 500.0000 mg | ORAL_TABLET | Freq: Three times a day (TID) | ORAL | 0 refills | Status: DC

## 2016-10-09 MED ORDER — POLYETHYLENE GLYCOL 3350 17 G PO PACK: 17.0000 g | PACK | Freq: Every day | ORAL | 0 refills | Status: DC
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
## 2016-10-09 NOTE — Progress Notes (Signed)
Patient had nose bleed at 1500 today. Night shift RN notified in report.

## 2016-10-09 NOTE — Discharge Summary (Addendum)
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 metatarsal bone. Avulsion fracture of the medial aspect proximal right second metatarsal bone consistent with Lisfranc injury. Comminuted and displaced fractures of the medial cuneiform with articular involvement. Electronically Signed   By: Lucienne Capers M.D.   On: 10/06/2016 22:10    Microbiology: Recent Results (from the past 240 hour(s))  Surgical pcr screen     Status: None   Collection Time: 10/07/16  2:45 AM  Result Value Ref Range Status   MRSA, PCR NEGATIVE NEGATIVE Final   Staphylococcus aureus NEGATIVE NEGATIVE Final    Comment:        The Xpert SA Assay (FDA approved for NASAL specimens in patients over 66 years of age), is one component of a comprehensive surveillance program.  Test performance has been validated by [facility] for patients greater than or equal to 13 year old. It is not intended to diagnose infection nor to guide or monitor  treatment.      Labs: CBC:  Recent Labs Lab 10/06/16 1905 10/07/16 0358 10/08/16 0641 10/09/16 0441  WBC 14.2* 13.4* 12.9* 12.9*  HGB 16.2 15.2 13.8 13.9  HCT 47.2 44.5 41.5 41.7  MCV 86.4 86.7 88.1 87.8  PLT 307 284 221 993   Basic Metabolic Panel:  Recent Labs Lab 10/06/16 1905 10/07/16 0358 10/08/16 0641 10/09/16 0441  NA 141 138 136 138  K 3.6 3.8 3.8 4.0  CL 104 104 103 101  CO2 25 25 27 29   GLUCOSE 112* 114* 102* 90  BUN 15 10 11 10   CREATININE 1.07 1.00 1.04 0.99  CALCIUM 9.3 8.8* 8.3* 8.5*  MG  --  1.8 1.8  --    Liver Function Tests:  Recent Labs Lab 10/06/16 1905 10/07/16 0358  AST 28 24  ALT 31 26  ALKPHOS 50 45  BILITOT 1.3* 1.8*  PROT 7.2 6.9  ALBUMIN 4.3 4.0   No results for input(s): LIPASE, AMYLASE in the last 168 hours. No results for input(s): AMMONIA in the last 168 hours. Cardiac Enzymes:  Recent Labs Lab 10/07/16 0358  TROPONINI <0.03   BNP (last 3 results) No results for input(s): BNP in the last 8760 hours. CBG: No results for input(s): GLUCAP in the last 168 hours. Time spent: 30 minutes  Signed:  Berle Mull  [facility]  10/09/2016  , 4:07  PM
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
## 2016-10-10 ENCOUNTER — Telehealth: Payer: Self-pay | Admitting: *Deleted

## 2016-10-10 LAB — CBC
HEMATOCRIT: 40 % (ref 39.0–52.0)
HEMOGLOBIN: 13.9 g/dL (ref 13.0–17.0)
MCH: 30 pg (ref 26.0–34.0)
MCHC: 34.8 g/dL (ref 30.0–36.0)
MCV: 86.4 fL (ref 78.0–100.0)
Platelets: 235 10*3/uL (ref 150–400)
RBC: 4.63 MIL/uL (ref 4.22–5.81)
RDW: 12.6 % (ref 11.5–15.5)
WBC: 11.7 10*3/uL — AB (ref 4.0–10.5)

## 2016-10-10 LAB — BASIC METABOLIC PANEL
ANION GAP: 10 (ref 5–15)
BUN: 9 mg/dL (ref 6–20)
CALCIUM: 8.8 mg/dL — AB (ref 8.9–10.3)
CO2: 28 mmol/L (ref 22–32)
Chloride: 99 mmol/L — ABNORMAL LOW (ref 101–111)
Creatinine, Ser: 0.98 mg/dL (ref 0.61–1.24)
GFR calc non Af Amer: >60 mL/min (ref >60)
Glucose, Bld: 86 mg/dL (ref 65–99)
POTASSIUM: 3.7 mmol/L (ref 3.5–5.1)
Sodium: 137 mmol/L (ref 135–145)
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
## 2016-10-10 NOTE — Progress Notes (Signed)
Orthopedic Tech Progress Note Patient Details:  Benjamin Collins May 10, 1966 [id number]  Ortho Devices Type of Ortho Device: Crutches Ortho Device/Splint Location: rle Ortho Device/Splint Interventions: Application   [name] 10/10/2016, 12:41 PM

## 2016-10-10 NOTE — Plan of Care (Signed)
Problem: Safety: Goal: Ability to remain free from injury will improve Outcome: Progressing Safety precautions noted  Problem: Pain Managment: Goal: General experience of comfort will improve Outcome: Progressing Medicated once for pain this shift with moderate relief  Problem: Tissue Perfusion: Goal: Risk factors for ineffective tissue perfusion will decrease Outcome: Progressing No S/S of DVT noted, SCDs are on  Problem: Bowel/Gastric: Goal: Will not experience complications related to bowel motility Outcome: Progressing Denies bowel and gastric issues, stated that he had BM on 10/09/2016

## 2016-10-10 NOTE — Progress Notes (Addendum)
PT Cancellation Note  Patient Details Name: Benjamin Collins MRN: [id number] DOB: [birth year]   Cancelled Treatment:    Reason Eval/Treat Not Completed: Patient declined, no reason specified Pt declined mobility and reported he feels comfortable with use of RW/crutches and stair negotiation. Pt reminded to wear CAM boot with any OOB mobility.    [place] April, PTA Pager: [phone]   10/10/2016, 2:30 PM

## 2016-10-10 NOTE — Telephone Encounter (Addendum)
Benjamin Collins states pt was to pick up medications at our office. I told her I had not received orders for his discharge medications and Katy checked and found signed rx for percocet and said she would give to pt at discharge.

## 2016-10-10 NOTE — Care Management (Addendum)
Case manager spoke with patient. He does not have personal insurance. Case manager appreciates work done on this by previous Tourist information centre manager. Patient will get crutches and RW from [facility], unfortunately does not qualify for home Health therapy. CM has discussed this with Stevie Kern, [place] Liaison, She will see if patient will qualify for [facility].

## 2016-10-11 ENCOUNTER — Encounter (HOSPITAL_COMMUNITY): Payer: Self-pay | Admitting: Podiatry

## 2016-10-13 ENCOUNTER — Telehealth: Payer: Self-pay | Admitting: *Deleted

## 2016-10-13 NOTE — Telephone Encounter (Signed)
Patient called asking when he can remove the bandages from surgery. I asked if he had a post op appt coming up and he says on Wed (3/28). I informed him that he should leave all bandages on until his first post op appt. He begin to explain that me that he was in his PCP office now and they changed the bandages. I told him that was fine for them to do but not to remove the bandages himself until he saw Dr. Amalia Hailey. He demonstrated understanding of my explanation however sounded somewhat hesitate.

## 2016-10-18 ENCOUNTER — Encounter: Payer: Self-pay | Admitting: Podiatry

## 2016-10-18 ENCOUNTER — Ambulatory Visit (INDEPENDENT_AMBULATORY_CARE_PROVIDER_SITE_OTHER): Payer: Self-pay | Admitting: Podiatry

## 2016-10-18 DIAGNOSIS — Z9889 Other specified postprocedural states: Secondary | ICD-10-CM

## 2016-10-18 MED ORDER — ALPRAZOLAM 0.25 MG PO TABS: 0.2500 mg | ORAL_TABLET | Freq: Every evening | ORAL | 0 refills | Status: DC | PRN
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
## 2016-10-25 ENCOUNTER — Ambulatory Visit (INDEPENDENT_AMBULATORY_CARE_PROVIDER_SITE_OTHER): Payer: Self-pay | Admitting: Podiatry

## 2016-10-25 ENCOUNTER — Encounter: Payer: Self-pay | Admitting: Podiatry

## 2016-10-25 DIAGNOSIS — Z9889 Other specified postprocedural states: Secondary | ICD-10-CM
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
## 2016-11-01 ENCOUNTER — Ambulatory Visit (INDEPENDENT_AMBULATORY_CARE_PROVIDER_SITE_OTHER): Payer: Self-pay | Admitting: Podiatry

## 2016-11-01 ENCOUNTER — Ambulatory Visit (INDEPENDENT_AMBULATORY_CARE_PROVIDER_SITE_OTHER): Payer: Self-pay

## 2016-11-01 DIAGNOSIS — Z9889 Other specified postprocedural states: Secondary | ICD-10-CM

## 2016-11-01 MED ORDER — SULFAMETHOXAZOLE-TRIMETHOPRIM 800-160 MG PO TABS: 1.0000 | ORAL_TABLET | Freq: Two times a day (BID) | ORAL | 0 refills | Status: DC
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
## 2016-11-08 ENCOUNTER — Ambulatory Visit (INDEPENDENT_AMBULATORY_CARE_PROVIDER_SITE_OTHER): Payer: Self-pay | Admitting: Podiatry

## 2016-11-08 DIAGNOSIS — Z9889 Other specified postprocedural states: Secondary | ICD-10-CM
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
## 2016-11-15 ENCOUNTER — Ambulatory Visit: Payer: Self-pay

## 2016-11-15 ENCOUNTER — Ambulatory Visit (INDEPENDENT_AMBULATORY_CARE_PROVIDER_SITE_OTHER): Payer: Self-pay | Admitting: Podiatry

## 2016-11-15 DIAGNOSIS — Z9889 Other specified postprocedural states: Secondary | ICD-10-CM
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
## 2016-11-22 ENCOUNTER — Ambulatory Visit (INDEPENDENT_AMBULATORY_CARE_PROVIDER_SITE_OTHER): Payer: Self-pay | Admitting: Podiatry

## 2016-11-22 ENCOUNTER — Ambulatory Visit (INDEPENDENT_AMBULATORY_CARE_PROVIDER_SITE_OTHER): Payer: Self-pay

## 2016-11-22 DIAGNOSIS — Z9889 Other specified postprocedural states: Secondary | ICD-10-CM
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
## 2016-12-11 ENCOUNTER — Encounter: Payer: Self-pay | Admitting: Podiatry

## 2016-12-11 ENCOUNTER — Ambulatory Visit (INDEPENDENT_AMBULATORY_CARE_PROVIDER_SITE_OTHER): Payer: Self-pay | Admitting: Podiatry

## 2016-12-11 ENCOUNTER — Ambulatory Visit: Payer: Self-pay

## 2016-12-11 DIAGNOSIS — L97512 Non-pressure chronic ulcer of other part of right foot with fat layer exposed: Secondary | ICD-10-CM

## 2016-12-11 DIAGNOSIS — S92301D Fracture of unspecified metatarsal bone(s), right foot, subsequent encounter for fracture with routine healing: Secondary | ICD-10-CM

## 2016-12-11 MED ORDER — SULFAMETHOXAZOLE-TRIMETHOPRIM 800-160 MG PO TABS: 1.0000 | ORAL_TABLET | Freq: Two times a day (BID) | ORAL | 0 refills | Status: DC

## 2016-12-11 NOTE — Addendum Note (Signed)
Addended by: Tomi Bamberger on: 12/11/2016 01:45 PM   Modules accepted: Orders
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
## 2016-12-14 LAB — WOUND CULTURE
GRAM STAIN: NONE SEEN
GRAM STAIN: NONE SEEN

## 2016-12-23 NOTE — Addendum Note (Signed)
Addendum  created 12/23/16 1059 by Duane Boston, MD   Sign clinical note

## 2016-12-27 ENCOUNTER — Ambulatory Visit (INDEPENDENT_AMBULATORY_CARE_PROVIDER_SITE_OTHER): Payer: Self-pay | Admitting: Podiatry

## 2016-12-27 DIAGNOSIS — L97512 Non-pressure chronic ulcer of other part of right foot with fat layer exposed: Secondary | ICD-10-CM

## 2016-12-27 DIAGNOSIS — Z9889 Other specified postprocedural states: Secondary | ICD-10-CM

## 2016-12-27 DIAGNOSIS — S92301D Fracture of unspecified metatarsal bone(s), right foot, subsequent encounter for fracture with routine healing: Secondary | ICD-10-CM

## 2016-12-27 MED ORDER — AMOXICILLIN-POT CLAVULANATE 875-125 MG PO TABS: 1.0000 | ORAL_TABLET | Freq: Two times a day (BID) | ORAL | 0 refills | Status: DC
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
## 2017-01-10 ENCOUNTER — Ambulatory Visit (INDEPENDENT_AMBULATORY_CARE_PROVIDER_SITE_OTHER): Payer: Self-pay | Admitting: Podiatry

## 2017-01-10 ENCOUNTER — Encounter: Payer: Self-pay | Admitting: Podiatry

## 2017-01-10 DIAGNOSIS — S92301D Fracture of unspecified metatarsal bone(s), right foot, subsequent encounter for fracture with routine healing: Secondary | ICD-10-CM

## 2017-01-10 DIAGNOSIS — Z9889 Other specified postprocedural states: Secondary | ICD-10-CM
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
## 2017-01-31 ENCOUNTER — Ambulatory Visit (INDEPENDENT_AMBULATORY_CARE_PROVIDER_SITE_OTHER): Payer: Self-pay | Admitting: Podiatry

## 2017-01-31 ENCOUNTER — Encounter: Payer: Self-pay | Admitting: Podiatry

## 2017-01-31 DIAGNOSIS — S92301D Fracture of unspecified metatarsal bone(s), right foot, subsequent encounter for fracture with routine healing: Secondary | ICD-10-CM

## 2017-01-31 DIAGNOSIS — L97512 Non-pressure chronic ulcer of other part of right foot with fat layer exposed: Secondary | ICD-10-CM

## 2017-02-02 NOTE — Progress Notes (Signed)
   HPI: Patient presents today status post ORIF multiple foot fractures of the right foot. Date of surgery 10/07/2016. Patient has been doing very well and denies any significant pain. He is ambulating quite comfortably in the short cam boot. Patient is aware that he can begin transitioning into normal shoe gear, however he is hesitant to do so. Patient states that his small ulcer to the second interdigital webspace is improving significantly. He has been applying Neosporin daily and he believes this is helping to heal his wound. Patient presents today for further treatment and evaluation   Physical Exam: General: The patient is alert and oriented x3 in no acute distress.  Dermatology: There is a very small ulceration noted to the lateral aspect of the second digit second interspace measuring approximately [phone] cm. Wound base is mostly granular and pink. There is no exposed bone muscle-tendon ligament or joint. There is no odor. Negative for drainage. Periwound integrity is intact.  Vascular: Palpable pedal pulses bilaterally. No edema or erythema noted. Capillary refill within normal limits.  Neurological: Epicritic and protective threshold grossly intact bilaterally.   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 Muscle strength 5/5 in all groups bilateral.   Assessment: 1. Status post ORIF multiple foot fractures right foot. Date of surgery 10/07/2016. 2. Small superficial ulceration interdigital webspace right foot-improving   Plan of Care:  1. Patient was evaluated. 2. Continue Neosporin and a Band-Aid daily to the superficial ulceration. 3. Reinforced the recommendation of the patient can begin ambulating in good supportive tennis shoes. 4. Return to clinic in 2 weeks for follow-up on a superficial ulceration  Patient is going back to [place] August 7   Edrick Kins,  DPM [facility]  Dr. Edrick Kins, [address]                                        [place], [place] [address]                Office [phone]  Fax [phone]

## 2017-02-07 ENCOUNTER — Telehealth: Payer: Self-pay | Admitting: *Deleted

## 2017-02-07 ENCOUNTER — Ambulatory Visit (INDEPENDENT_AMBULATORY_CARE_PROVIDER_SITE_OTHER): Payer: Self-pay | Admitting: Podiatry

## 2017-02-07 DIAGNOSIS — L97512 Non-pressure chronic ulcer of other part of right foot with fat layer exposed: Secondary | ICD-10-CM

## 2017-02-07 MED ORDER — AMOXICILLIN-POT CLAVULANATE 875-125 MG PO TABS: 1.0000 | ORAL_TABLET | Freq: Two times a day (BID) | ORAL | 0 refills | Status: DC

## 2017-02-07 MED ORDER — GENTAMICIN SULFATE 0.1 % EX CREA: 1.0000 "application " | TOPICAL_CREAM | Freq: Three times a day (TID) | CUTANEOUS | 1 refills | Status: DC

## 2017-02-07 NOTE — Telephone Encounter (Signed)
Dr. Amalia Hailey states pt sent pictures of his toe, ordered Augmentin 875/125 #20 one tablet bid and get and appt. I informed pt of Dr. Amalia Hailey orders and pt states understanding and he is on the other line scheduling an appt now.

## 2017-02-09 NOTE — Progress Notes (Signed)
   HPI: Patient presents today for follow-up evaluation of an ulcer to the interdigital area of the right foot. Ulcer is located to the lateral aspect of the second digit right foot. Patient states that approximately 3 days ago he was very aggressive and scrubbed his foot with a ragged so. The following day he noticed significant erythema and redness to the area. Patient presents today for further treatment and evaluation. Patient's concerned for infection. Antibiotic prescribed prior to office visit when the patient initially called in.   Physical Exam: General: The patient is alert and oriented x3 in no acute distress.  Dermatology: Superficial ulceration noted to the second interdigital webspace right foot. Today the wound appears healthy stable and viable. Periwound integrity is intact. No significant synovitis erythema noted. Minimal drainage. Wound base is red and granular. Wound measures approximately [phone] cm.  Vascular: Palpable pedal pulses bilaterally. No edema or erythema noted. Capillary refill within normal limits.  Neurological: Epicritic and protective threshold grossly intact bilaterally.   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 Muscle strength 5/5 in all groups bilateral.  Assessment: Superficial ulceration interdigital webspace right foot-improved   Plan of Care:  1. Today the patient was evaluated. Ulceration was evaluated. 2. Recommended the patient not be so aggressive 1 cleaning the ulcer. Continue oral antibiotics which were prescribed prior to office visit. Continue application of Neosporin and a Band-Aid daily. 3. Continue ambulating in good supportive tennis shoes. Immobilization cam boot when necessary during long periods of standing. 4. Return to clinic in 2 weeks for follow-up on the superficial ulceration.  Patient is going to [place] August  7   Edrick Kins, DPM [facility]  Dr. Edrick Kins, DPM    [address], [place] [address]                Office [phone]  Fax [phone]

## 2017-02-14 ENCOUNTER — Encounter: Payer: Self-pay | Admitting: Podiatry

## 2017-02-19 ENCOUNTER — Encounter: Payer: Self-pay | Admitting: Podiatry

## 2017-02-21 ENCOUNTER — Encounter: Payer: Self-pay | Admitting: Podiatry

## 2017-04-13 ENCOUNTER — Telehealth: Payer: Self-pay | Admitting: *Deleted

## 2017-04-13 DIAGNOSIS — Z9889 Other specified postprocedural states: Secondary | ICD-10-CM

## 2017-04-13 NOTE — Telephone Encounter (Addendum)
-----   Message from Edrick Kins, DPM sent at 04/13/2017  8:17 AM EDT ----- Regarding: Physical Therapy Please order physical therapy 3x/week x 4 weeks Dx: s/p ORIF multiple foot fractures right  Thanks, Dr. Amalia Hailey. Orders faxed to [facility] R. Komonski states pt is self-pay, I noted on [employer] form.

## 2018-04-22 ENCOUNTER — Ambulatory Visit (INDEPENDENT_AMBULATORY_CARE_PROVIDER_SITE_OTHER): Payer: Self-pay | Admitting: Sports Medicine

## 2018-04-22 ENCOUNTER — Encounter: Payer: Self-pay | Admitting: Sports Medicine

## 2018-04-22 VITALS — BP 136/80 | Ht 70.0 in | Wt 222.0 lb

## 2018-04-22 DIAGNOSIS — M546 Pain in thoracic spine: Secondary | ICD-10-CM

## 2018-04-22 MED ORDER — CYCLOBENZAPRINE HCL 10 MG PO TABS: ORAL_TABLET | ORAL | 0 refills | Status: DC
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
## 2019-01-29 ENCOUNTER — Telehealth: Payer: Self-pay | Admitting: Cardiology

## 2019-01-29 NOTE — Telephone Encounter (Signed)

## 2019-01-30 ENCOUNTER — Telehealth: Payer: Self-pay | Admitting: *Deleted

## 2019-01-30 ENCOUNTER — Ambulatory Visit (INDEPENDENT_AMBULATORY_CARE_PROVIDER_SITE_OTHER): Payer: Self-pay | Admitting: Cardiology

## 2019-01-30 ENCOUNTER — Encounter: Payer: Self-pay | Admitting: Cardiology

## 2019-01-30 ENCOUNTER — Other Ambulatory Visit: Payer: Self-pay

## 2019-01-30 VITALS — BP 142/68 | HR 82 | Temp 97.1°F | Ht 70.0 in | Wt 229.8 lb

## 2019-01-30 DIAGNOSIS — G43101 Migraine with aura, not intractable, with status migrainosus: Secondary | ICD-10-CM

## 2019-01-30 DIAGNOSIS — Z8679 Personal history of other diseases of the circulatory system: Secondary | ICD-10-CM

## 2019-01-30 DIAGNOSIS — I493 Ventricular premature depolarization: Secondary | ICD-10-CM
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
## 2019-01-30 NOTE — Telephone Encounter (Signed)
CONTACTED PATIENT TO COME @ 2:20 FOR A 2:40 APPT.  PATIENT VERBALIZED UNDERSTANDING.

## 2019-01-30 NOTE — Patient Instructions (Addendum)
Medication Instructions:   NO CHANGES  Lab work: NOT NEEDED   Testing/Procedures: Will be schedule at [facility] has requested that you have an echocardiogram. Echocardiography is a painless test that uses sound waves to create images of your heart. It provides your doctor with information about the size and shape of your heart and how well your heart's chambers and valves are working. This procedure takes approximately one hour. There are no restrictions for this procedure.  And  Your physician has recommended that you wear an event monitor 3 day ZIO PATCH . Event monitors are medical devices that record the heart's electrical activity. Doctors most often [place] these monitors to diagnose arrhythmias. Arrhythmias are problems with the speed or rhythm of the heartbeat. The monitor is a small, portable device. You can wear one while you do your normal daily activities. This is usually used to diagnose what is causing palpitation.   Follow-Up: At [facility], you and your health needs are our priority.  As part of our continuing mission to provide you with exceptional heart care, we have created designated Provider Care Teams.  These Care Teams include your primary Cardiologist (physician) and Advanced Practice Providers (APPs -  Physician Assistants and Nurse Practitioners) who all work together to provide you with the care you need, when you need it. . You will need a follow up appointment in  2- 3  Months VIRTUAL VISIT.  Please call our office 2 months in advance to schedule this appointment.  You may see Glenetta Hew, MD or one of the following Advanced Practice Providers on your designated Care Team:   . Rosaria Ferries, PA-C . Jory Sims, DNP, ANP  Any Other Special Instructions Will Be Listed Below.

## 2019-02-05 ENCOUNTER — Encounter: Payer: Self-pay | Admitting: Cardiology

## 2019-02-05 ENCOUNTER — Telehealth: Payer: Self-pay | Admitting: *Deleted

## 2019-02-05 DIAGNOSIS — Z8679 Personal history of other diseases of the circulatory system: Secondary | ICD-10-CM | POA: Insufficient documentation

## 2019-02-05 DIAGNOSIS — G43101 Migraine with aura, not intractable, with status migrainosus: Secondary | ICD-10-CM | POA: Insufficient documentation

## 2019-02-05 NOTE — Telephone Encounter (Signed)
RN  RECEIVE MESSAGE FROM DR HARDING   CALLED PATIENT -  NO ANSWER - LEFT MESSAGE ON SECURE VOICE MAIL -  3 DAY  ZIO MONITOR  WAS ORDERED  PER  REQUEST OF DR HARDING.  (  INFO - OFFICE WILL CALL AND INSTRUCTION GIVEN FOR WEARING AND RETURNING MONITOR) ANY QUESTION MAY CALL BACK

## 2019-02-05 NOTE — Telephone Encounter (Signed)
-----   Message from Leonie Man, MD sent at 02/05/2019  1:31 AM EDT ----- Regarding: ? forgot to order event monitor on this patient -- can be 3 day Ivin Booty, I must of not mentioned to you that we are planning on ordering a monitor for this patient.  3 to 14-day ZIO patch is okay.  Glenetta Hew, MD

## 2019-02-05 NOTE — Assessment & Plan Note (Signed)
In the past was told he had a hole in his heart of some sort.  With him having significant migraine headaches, would need to exclude PFO.  We are checking a 2D echocardiogram to evaluate for structural abnormalities for PVCs.  Will add bubble study.

## 2019-02-05 NOTE — Assessment & Plan Note (Signed)
PVCs noted on exam, and given his history of migraine headaches as well as dizziness and nausea type symptoms, would need to exclude structural abnormality and also determine PVC burden with monitor.  Plan: Echocardiogram with bubble and monitor.

## 2019-02-05 NOTE — Assessment & Plan Note (Signed)
History of murmur heard on exam somewhere, but I do not hear 1 today.  It may have been more of a possible PFO that was seen. Plan: Check 2D echo.

## 2019-02-05 NOTE — Addendum Note (Signed)
Addended by: Raiford Simmonds on: 02/05/2019 02:29 PM   Modules accepted: Orders

## 2019-02-06 ENCOUNTER — Other Ambulatory Visit: Payer: Self-pay

## 2019-02-06 ENCOUNTER — Ambulatory Visit (HOSPITAL_COMMUNITY): Payer: Self-pay | Attending: Internal Medicine

## 2019-02-06 DIAGNOSIS — I493 Ventricular premature depolarization: Secondary | ICD-10-CM

## 2019-02-06 DIAGNOSIS — Z8679 Personal history of other diseases of the circulatory system: Secondary | ICD-10-CM

## 2019-02-06 DIAGNOSIS — G43101 Migraine with aura, not intractable, with status migrainosus: Secondary | ICD-10-CM

## 2019-02-12 ENCOUNTER — Telehealth: Payer: Self-pay | Admitting: Radiology

## 2019-02-12 NOTE — Telephone Encounter (Signed)
Enrolled patient for a 3 day Zio monitor to be mailed. Brief instructions were gone over with the patient and he knows to expect the monitor to arrive in 3-4 days

## 2019-03-13 ENCOUNTER — Emergency Department
Admission: EM | Admit: 2019-03-13 | Discharge: 2019-03-13 | Disposition: A | Discharge status: Home / Self Care | Payer: Self-pay | Source: Home / Self Care | Attending: Family Medicine | Admitting: Family Medicine

## 2019-03-13 ENCOUNTER — Other Ambulatory Visit: Payer: Self-pay

## 2019-03-13 ENCOUNTER — Emergency Department (INDEPENDENT_AMBULATORY_CARE_PROVIDER_SITE_OTHER): Payer: Self-pay

## 2019-03-13 DIAGNOSIS — R1013 Epigastric pain: Secondary | ICD-10-CM

## 2019-03-13 DIAGNOSIS — K5900 Constipation, unspecified: Secondary | ICD-10-CM

## 2019-03-13 DIAGNOSIS — R509 Fever, unspecified: Secondary | ICD-10-CM

## 2019-03-13 DIAGNOSIS — R109 Unspecified abdominal pain: Secondary | ICD-10-CM

## 2019-03-13 LAB — POCT URINALYSIS DIP (MANUAL ENTRY)
Glucose, UA: NEGATIVE mg/dL
Leukocytes, UA: NEGATIVE
Nitrite, UA: NEGATIVE
Protein Ur, POC: 30 mg/dL — AB
Spec Grav, UA: >=1.03 — AB (ref 1.010–1.025)
Urobilinogen, UA: 1 E.U./dL
pH, UA: 5.5 (ref 5.0–8.0)

## 2019-03-13 LAB — POCT CBC W AUTO DIFF (K'VILLE URGENT CARE)

## 2019-03-13 MED ORDER — CIPROFLOXACIN HCL 500 MG PO TABS: ORAL_TABLET | ORAL | 0 refills | Status: AC

## 2019-03-13 MED ORDER — METRONIDAZOLE 500 MG PO TABS: ORAL_TABLET | ORAL | 0 refills | Status: AC

## 2019-03-13 NOTE — Discharge Instructions (Addendum)
Begin clear liquids (apple juice, clear grape juice, jello, etc) for about 24 hours, then may begin a BRAT diet (Bananas, [place], Applesauce, Toast) if abdominal pain improved. After 24 hours may gradually advance to a regular diet as tolerated.  Avoid milk products until well.   If symptoms become significantly worse during the night or over the weekend, proceed to the local emergency room.

## 2019-03-13 NOTE — ED Triage Notes (Signed)
For 10 days - 2 weeks had fever and body aches.  Sat and Sun felt better.  Monday, aches, fever, and felt bad,  Had Covid test on Tuesday, came back neg yesterday.  Abd pain, mid abd.  Sore to the touch.  Fever between 99-100.5.  Saw a [facility] doc, and advised to come to [facility]
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
## 2019-03-14 ENCOUNTER — Telehealth: Payer: Self-pay | Admitting: Emergency Medicine

## 2019-03-14 LAB — LIPASE: Lipase: 31 U/L (ref 7–60)

## 2019-03-14 LAB — COMPLETE METABOLIC PANEL WITH GFR
AG Ratio: 1.6 (calc) (ref 1.0–2.5)
ALT: 52 U/L — ABNORMAL HIGH (ref 9–46)
AST: 30 U/L (ref 10–35)
Albumin: 4.4 g/dL (ref 3.6–5.1)
Alkaline phosphatase (APISO): 58 U/L (ref 35–144)
BUN: 16 mg/dL (ref 7–25)
CO2: 25 mmol/L (ref 20–32)
Calcium: 9.4 mg/dL (ref 8.6–10.3)
Chloride: 102 mmol/L (ref 98–110)
Creat: 1.12 mg/dL (ref 0.70–1.33)
GFR, Est African American: 87 mL/min/{1.73_m2} (ref >=60)
GFR, Est Non African American: 75 mL/min/{1.73_m2} (ref >=60)
Globulin: 2.7 g/dL (calc) (ref 1.9–3.7)
Glucose, Bld: 143 mg/dL — ABNORMAL HIGH (ref 65–99)
Potassium: 4.2 mmol/L (ref 3.5–5.3)
Sodium: 137 mmol/L (ref 135–146)
Total Bilirubin: 1.3 mg/dL — ABNORMAL HIGH (ref 0.2–1.2)
Total Protein: 7.1 g/dL (ref 6.1–8.1)

## 2019-03-14 LAB — AMYLASE: Amylase: 70 U/L (ref 21–101)

## 2019-03-14 NOTE — Telephone Encounter (Signed)
LVM to call us for lab results.

## 2019-03-14 NOTE — Telephone Encounter (Signed)
Patient called for lab results; he is feeling worse and did not rest well last night. Results given to him along with advise to be evaluated by PCP and/or [facility]; numbers given; labs placed at front desk for him to take to appt.

## 2019-04-29 ENCOUNTER — Ambulatory Visit: Payer: Self-pay | Admitting: Osteopathic Medicine

## 2019-12-06 ENCOUNTER — Other Ambulatory Visit: Payer: Self-pay

## 2019-12-06 ENCOUNTER — Emergency Department (HOSPITAL_COMMUNITY)
Admission: EM | Admit: 2019-12-06 | Discharge: 2019-12-06 | Disposition: A | Discharge status: Home / Self Care | Payer: Self-pay | Attending: Emergency Medicine | Admitting: Emergency Medicine

## 2019-12-06 ENCOUNTER — Encounter (HOSPITAL_COMMUNITY): Payer: Self-pay | Admitting: *Deleted

## 2019-12-06 DIAGNOSIS — Z5321 Procedure and treatment not carried out due to patient leaving prior to being seen by health care provider: Secondary | ICD-10-CM | POA: Insufficient documentation

## 2019-12-06 DIAGNOSIS — R109 Unspecified abdominal pain: Secondary | ICD-10-CM | POA: Insufficient documentation

## 2019-12-06 LAB — CBC
HCT: 51.6 % (ref 39.0–52.0)
Hemoglobin: 17.4 g/dL — ABNORMAL HIGH (ref 13.0–17.0)
MCH: 29.3 pg (ref 26.0–34.0)
MCHC: 33.7 g/dL (ref 30.0–36.0)
MCV: 87 fL (ref 80.0–100.0)
Platelets: 280 10*3/uL (ref 150–400)
RBC: 5.93 MIL/uL — ABNORMAL HIGH (ref 4.22–5.81)
RDW: 12.1 % (ref 11.5–15.5)
WBC: 14 10*3/uL — ABNORMAL HIGH (ref 4.0–10.5)
nRBC: 0 % (ref 0.0–0.2)

## 2019-12-06 LAB — COMPREHENSIVE METABOLIC PANEL
ALT: 28 U/L (ref 0–44)
AST: 25 U/L (ref 15–41)
Albumin: 4.5 g/dL (ref 3.5–5.0)
Alkaline Phosphatase: 66 U/L (ref 38–126)
Anion gap: 11 (ref 5–15)
BUN: 17 mg/dL (ref 6–20)
CO2: 25 mmol/L (ref 22–32)
Calcium: 8.9 mg/dL (ref 8.9–10.3)
Chloride: 105 mmol/L (ref 98–111)
Creatinine, Ser: 1.05 mg/dL (ref 0.61–1.24)
GFR calc Af Amer: >60 mL/min (ref >60)
GFR calc non Af Amer: >60 mL/min (ref >60)
Glucose, Bld: 158 mg/dL — ABNORMAL HIGH (ref 70–99)
Potassium: 4.2 mmol/L (ref 3.5–5.1)
Sodium: 141 mmol/L (ref 135–145)
Total Bilirubin: 1.5 mg/dL — ABNORMAL HIGH (ref 0.3–1.2)
Total Protein: 7.4 g/dL (ref 6.5–8.1)

## 2019-12-06 LAB — URINALYSIS, ROUTINE W REFLEX MICROSCOPIC
Bacteria, UA: NONE SEEN
Bilirubin Urine: NEGATIVE
Glucose, UA: NEGATIVE mg/dL
Ketones, ur: NEGATIVE mg/dL
Leukocytes,Ua: NEGATIVE
Nitrite: NEGATIVE
Protein, ur: 30 mg/dL — AB
Specific Gravity, Urine: 1.026 (ref 1.005–1.030)
pH: 5 (ref 5.0–8.0)

## 2019-12-06 LAB — LIPASE, BLOOD: Lipase: 36 U/L (ref 11–51)

## 2019-12-06 MED ORDER — SODIUM CHLORIDE 0.9% FLUSH: 3.0000 mL | Freq: Once | INTRAVENOUS | Status: DC

## 2019-12-06 NOTE — ED Triage Notes (Signed)
The pt is c/o abd pain he has known gallstones and he has had more pain for the past 4 hours  He does not wish to siuit down because it does not look clean

## 2019-12-06 NOTE — ED Notes (Signed)
No answer from pt in lobby

## 2020-10-26 ENCOUNTER — Other Ambulatory Visit: Payer: Self-pay | Admitting: Neurosurgery

## 2020-10-26 DIAGNOSIS — D170 Benign lipomatous neoplasm of skin and subcutaneous tissue of head, face and neck: Secondary | ICD-10-CM

## 2020-10-26 DIAGNOSIS — M549 Dorsalgia, unspecified: Secondary | ICD-10-CM

## 2020-10-26 DIAGNOSIS — M546 Pain in thoracic spine: Secondary | ICD-10-CM

## 2020-10-26 DIAGNOSIS — M542 Cervicalgia: Secondary | ICD-10-CM

## 2020-11-24 ENCOUNTER — Ambulatory Visit
Admission: RE | Admit: 2020-11-24 | Discharge: 2020-11-24 | Disposition: A | Discharge status: Home / Self Care | Payer: Self-pay | Source: Ambulatory Visit | Attending: Neurosurgery | Admitting: Neurosurgery

## 2020-11-24 ENCOUNTER — Other Ambulatory Visit: Payer: Self-pay

## 2020-11-24 DIAGNOSIS — D170 Benign lipomatous neoplasm of skin and subcutaneous tissue of head, face and neck: Secondary | ICD-10-CM

## 2020-11-24 DIAGNOSIS — M546 Pain in thoracic spine: Secondary | ICD-10-CM

## 2020-11-24 DIAGNOSIS — M542 Cervicalgia: Secondary | ICD-10-CM

## 2020-11-24 DIAGNOSIS — M549 Dorsalgia, unspecified: Secondary | ICD-10-CM

## 2021-11-29 IMAGING — MR MR LUMBAR SPINE W/O CM
4 of 5 series · 23 of 48 positions shown · non-contrast
Comparison: 10/08/2016 and prior.

CLINICAL DATA: Neck pain, back pain.

EXAM:
MRI CERVICAL, THORACIC AND LUMBAR SPINE WITHOUT CONTRAST
TECHNIQUE: Multiplanar and multiecho pulse sequences of the cervical spine, to
include the craniocervical junction and cervicothoracic junction,
and thoracic and lumbar spine, were obtained without intravenous
contrast.

[Series 2: T2 · sagittal · 4.0mm · 0.55mm/px · 6 of 16 slices shown (1 of 2)]
[im 1/16]
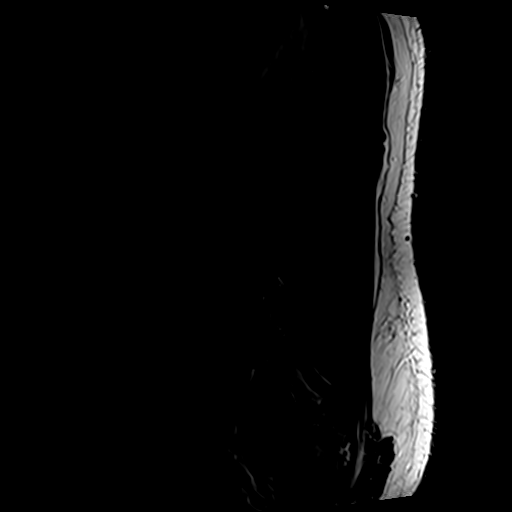
[im 4/16]
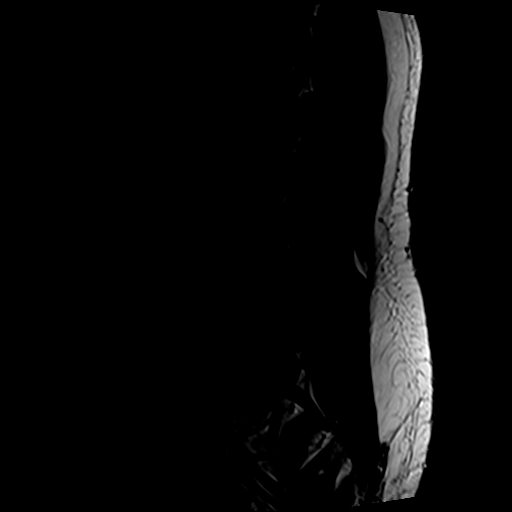
[im 7/16]
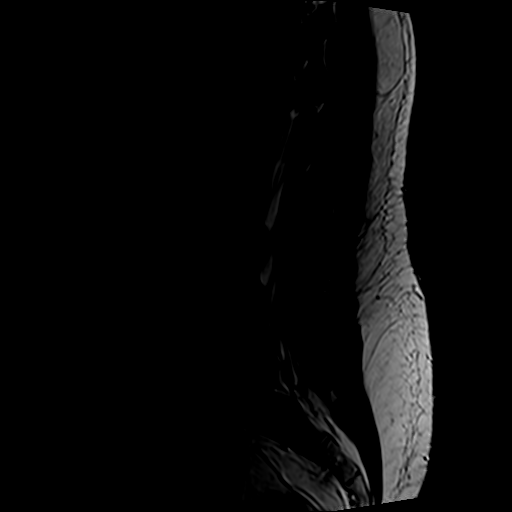
[im 10/16]
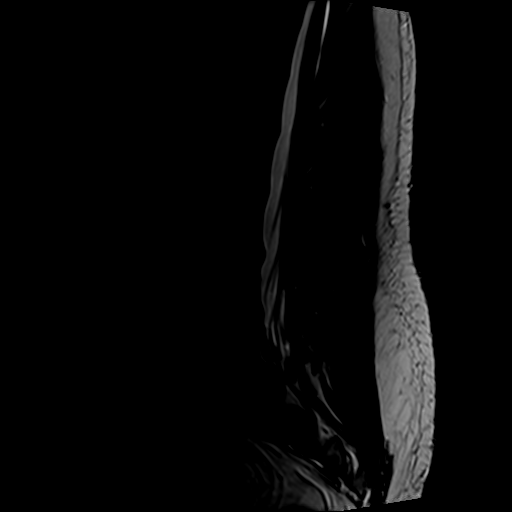
[im 13/16]
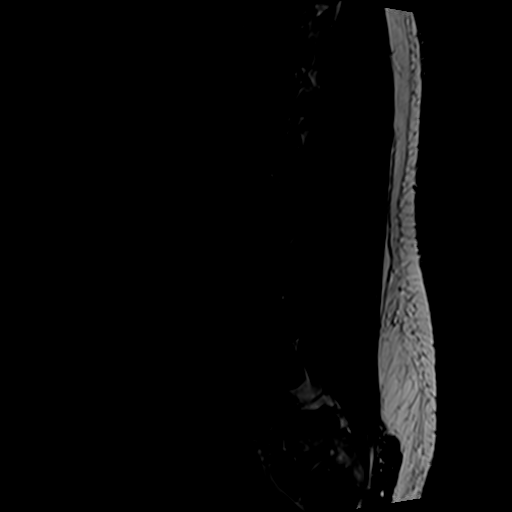
[im 16/16]
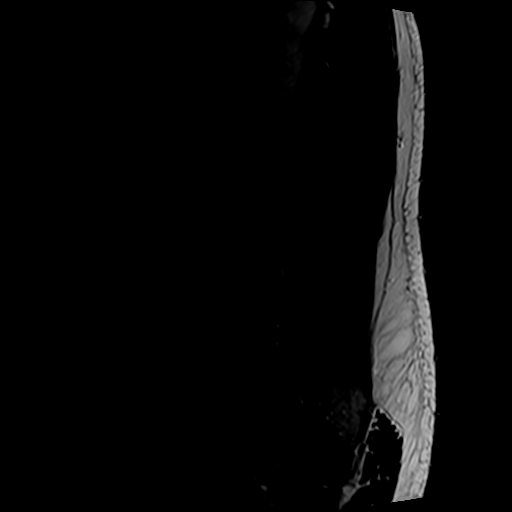

[Series 4: T1 · sagittal · 4.0mm · 0.55mm/px · 5 of 16 slices shown (1 of 2)]
[im 1/16]
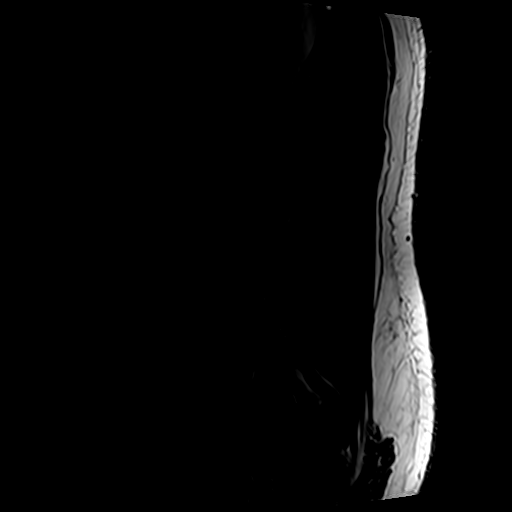
[im 4/16]
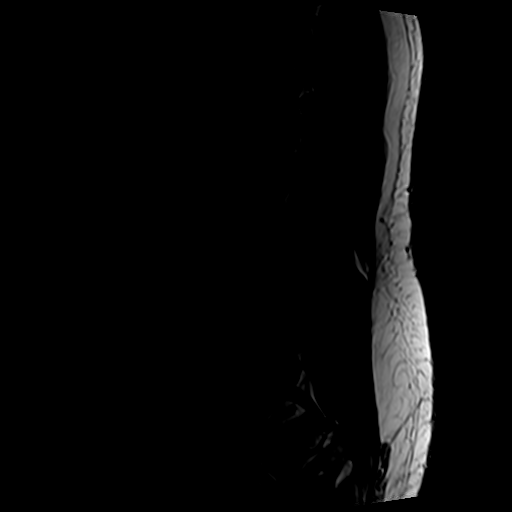
[im 7/16]
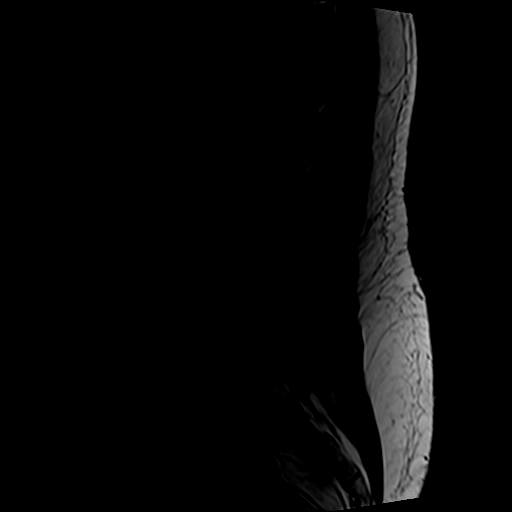
[im 10/16]
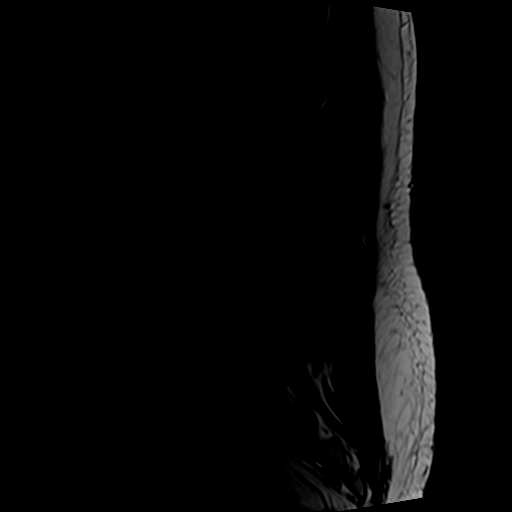
[im 16/16]
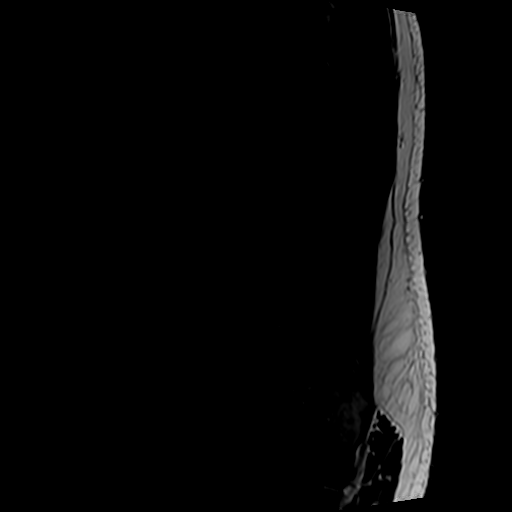

[Series 5: T2 · axial · 4.0mm · 0.70mm/px · z∈[-133,+77]mm · 9 of 38 slices shown (2 of 2)]
[im 1/38]
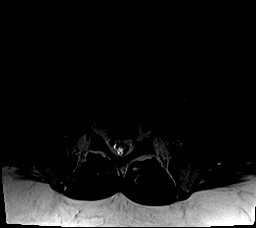
[im 6/38]
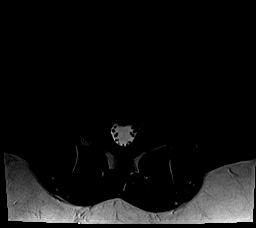
[im 11/38]
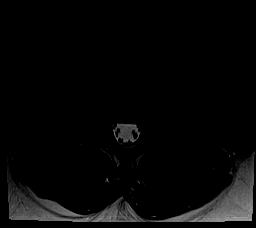
[im 16/38]
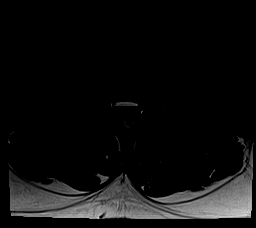
[im 19/38]
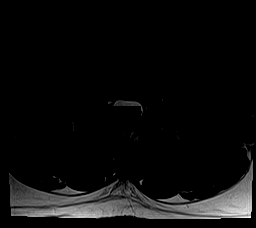
[im 22/38]
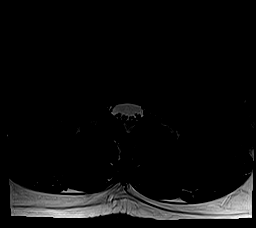
[im 27/38]
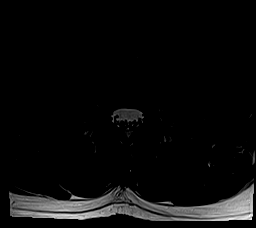
[im 32/38]
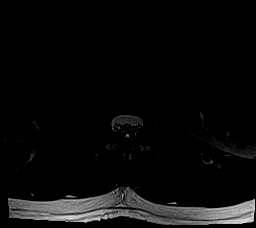
[im 38/38]
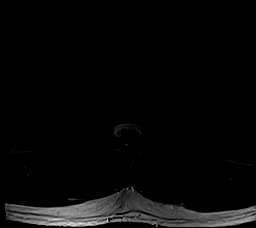

[Series 6: T1 · axial · 4.0mm · 0.35mm/px · z∈[-108,+46]mm · 3 of 38 slices shown (2 of 2)]
[im 6/38]
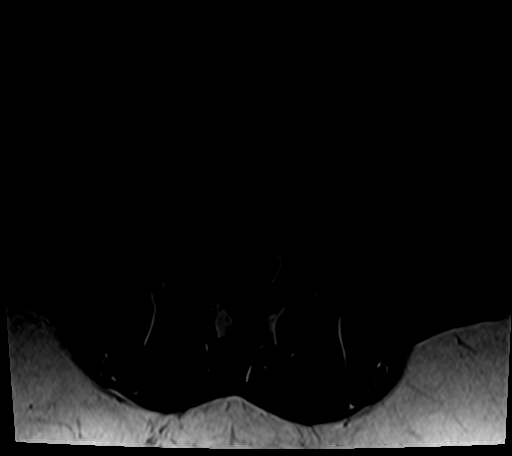
[im 19/38]
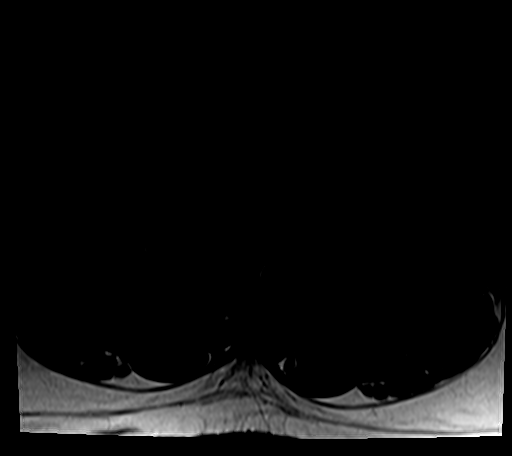
[im 32/38]
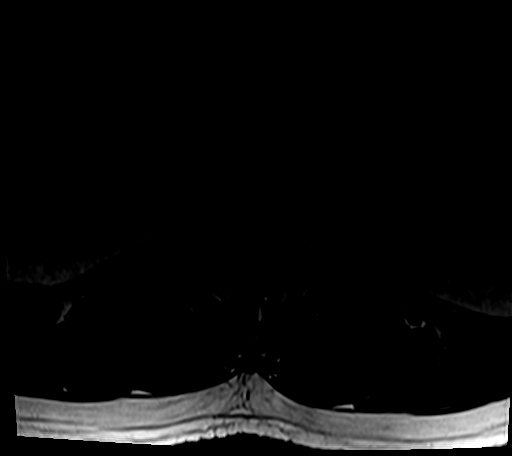

[23 of 48 positions shown; findings below may reference images not displayed]

FINDINGS: MRI CERVICAL SPINE FINDINGS

Alignment: Straightening of lordosis.

Vertebrae: Normal bone marrow signal intensity. No fracture or
aggressive osseous lesion. Sequela of C6-7 ACDF.

Cord: Normal signal and morphology.

Posterior Fossa, vertebral arteries: Please see concurrent MRI head
for findings above the foramen magnum.

Disc levels: Multilevel desiccation.

C2-3: Uncovertebral and facet degenerative spurring. Patent spinal
canal and neural foramen.

C3-4: Central protrusion partially effacing the ventral CSF
containing spaces with uncovertebral and facet degenerative
spurring. Mild spinal canal narrowing. Patent neural foramen.

C4-5: Disc osteophyte complex with superimposed central and right
foraminal protrusions. Uncovertebral and facet degenerative
spurring. Mild spinal canal, moderate right and mild left neural
foraminal narrowing.

C5-6: Disc osteophyte complex with superimposed right foraminal and
left subarticular protrusions. Uncovertebral and facet degenerative
spurring. Patent spinal canal. Mild right greater than left neural
foraminal narrowing.

C6-7: Sequela of fusion. Query small right foraminal protrusion.
Uncovertebral and facet degenerative spurring. Patent spinal canal.
Mild bilateral neural foraminal narrowing.

C7-T1: Uncovertebral and facet hypertrophy. Patent spinal canal.
Mild bilateral neural foraminal narrowing.

Paraspinal tissues: Negative.

MRI THORACIC SPINE FINDINGS

Alignment:  Normal.

Vertebrae: Normal bone marrow signal intensity. No fracture or
aggressive osseous lesion.

Cord:  Normal signal and morphology.

Paraspinal and other soft tissues: Negative.

Disc levels:

Multilevel desiccation. Patent spinal canal. Facet degenerative
spurring with mild right T5-6, T3-4 neural foraminal narrowing.

MRI LUMBAR SPINE FINDINGS

Segmentation: Lowest well-formed intervertebral disc space is
assumed to be the L5-S1 level.

Alignment:  Straightening of lordosis.

Vertebrae: Normal bone marrow signal intensity. No fracture or
aggressive osseous lesion. Small hemangiomata.

Conus medullaris and cauda equina: Conus extends to the L1 level.
Conus and cauda equina appear normal.

Disc levels: Multilevel desiccation and disc space loss.

T12-L1: No significant disc bulge. Patent spinal canal and neural
foramen.

L1-2: No significant disc bulge. Facet degenerative spurring. Patent
spinal canal and neural foramen.

L2-3: No significant disc bulge. Patent spinal canal and neural
foramen.

L3-4: Disc bulge with superimposed left foraminal protrusion. Facet
degenerative spurring. Patent spinal canal and right neural foramen.
Mild left neural foraminal narrowing.

L4-5: Mild disc bulge with shallow foraminal protrusions and right
foraminal annular fissuring. Facet degenerative spurring. Patent
spinal canal. Mild to moderate bilateral neural foraminal narrowing.

L5-S1: Disc bulge with superimposed inferiorly oriented central and
right foraminal protrusions. There is abutment of the exiting right
L5 and descending bilateral S1 nerve roots. Bilateral facet
hypertrophy. Mild spinal canal and moderate bilateral neural
foraminal narrowing.

Paraspinal and other soft tissues: Negative.
IMPRESSION: MRI cervical spine:

Sequela of C6-7 fusion. Query small right foraminal protrusion. Mild
bilateral neural foraminal narrowing.

Moderate right C4-5 neural foraminal narrowing. Mild C3-4, C4-5
spinal canal narrowing.

Mild left C4-5, bilateral C5-6, C7-T1 neural foraminal narrowing.

MRI thoracic spine:

Minimal to mild degenerative changes. No significant spinal canal
stenosis or cord impingement.

Mild right C3-4, C5-6 neural foraminal narrowing.

MRI lumbar spine:

Central and right L5-S1 foraminal protrusions abutting the exiting
right L5/descending bilateral S1 nerve roots with mild spinal canal
and moderate bilateral neural foraminal narrowing.

Mild left L3-4 and mild to moderate bilateral L4-5 neural foraminal
narrowing.
